# Patient Record
Sex: Female | Born: 1950 | Race: Black or African American | Hispanic: No | Marital: Single | Smoking: Never smoker
Health system: Southern US, Community
[De-identification: ages and names within clinical notes are randomized; demographics above are authoritative.]

## PROBLEM LIST (undated history)

## (undated) DIAGNOSIS — E119 Type 2 diabetes mellitus without complications: Secondary | ICD-10-CM

## (undated) DIAGNOSIS — K219 Gastro-esophageal reflux disease without esophagitis: Secondary | ICD-10-CM

## (undated) DIAGNOSIS — E7800 Pure hypercholesterolemia, unspecified: Secondary | ICD-10-CM

## (undated) DIAGNOSIS — Z6825 Body mass index (BMI) 25.0-25.9, adult: Secondary | ICD-10-CM

## (undated) DIAGNOSIS — I10 Essential (primary) hypertension: Secondary | ICD-10-CM

## (undated) HISTORY — PX: ABDOMINAL HYSTERECTOMY: SHX81

## (undated) HISTORY — DX: Body mass index (BMI) 25.0-25.9, adult: Z68.25

---

## 2020-08-16 ENCOUNTER — Emergency Department (HOSPITAL_BASED_OUTPATIENT_CLINIC_OR_DEPARTMENT_OTHER): Payer: Medicare HMO

## 2020-08-16 ENCOUNTER — Other Ambulatory Visit: Payer: Self-pay

## 2020-08-16 ENCOUNTER — Encounter (HOSPITAL_BASED_OUTPATIENT_CLINIC_OR_DEPARTMENT_OTHER): Payer: Self-pay

## 2020-08-16 ENCOUNTER — Emergency Department (HOSPITAL_BASED_OUTPATIENT_CLINIC_OR_DEPARTMENT_OTHER)
Admission: EM | Admit: 2020-08-16 | Discharge: 2020-08-16 | Disposition: A | Discharge status: Home / Self Care | Payer: Medicare HMO | Attending: Emergency Medicine | Admitting: Emergency Medicine

## 2020-08-16 DIAGNOSIS — Z79899 Other long term (current) drug therapy: Secondary | ICD-10-CM | POA: Insufficient documentation

## 2020-08-16 DIAGNOSIS — R079 Chest pain, unspecified: Secondary | ICD-10-CM

## 2020-08-16 DIAGNOSIS — U071 COVID-19: Secondary | ICD-10-CM | POA: Insufficient documentation

## 2020-08-16 DIAGNOSIS — E119 Type 2 diabetes mellitus without complications: Secondary | ICD-10-CM | POA: Diagnosis not present

## 2020-08-16 DIAGNOSIS — R0789 Other chest pain: Secondary | ICD-10-CM | POA: Diagnosis present

## 2020-08-16 DIAGNOSIS — Z7982 Long term (current) use of aspirin: Secondary | ICD-10-CM | POA: Diagnosis not present

## 2020-08-16 HISTORY — DX: Type 2 diabetes mellitus without complications: E11.9

## 2020-08-16 HISTORY — DX: Gastro-esophageal reflux disease without esophagitis: K21.9

## 2020-08-16 HISTORY — DX: Pure hypercholesterolemia, unspecified: E78.00

## 2020-08-16 LAB — CBC
HCT: 36.7 % (ref 36.0–46.0)
Hemoglobin: 12 g/dL (ref 12.0–15.0)
MCH: 26 pg (ref 26.0–34.0)
MCHC: 32.7 g/dL (ref 30.0–36.0)
MCV: 79.6 fL — ABNORMAL LOW (ref 80.0–100.0)
Platelets: 244 10*3/uL (ref 150–400)
RBC: 4.61 MIL/uL (ref 3.87–5.11)
RDW: 14.1 % (ref 11.5–15.5)
WBC: 4.2 10*3/uL (ref 4.0–10.5)
nRBC: 0 % (ref 0.0–0.2)

## 2020-08-16 LAB — BASIC METABOLIC PANEL
Anion gap: 11 (ref 5–15)
BUN: 15 mg/dL (ref 8–23)
CO2: 23 mmol/L (ref 22–32)
Calcium: 9.4 mg/dL (ref 8.9–10.3)
Chloride: 102 mmol/L (ref 98–111)
Creatinine, Ser: 0.88 mg/dL (ref 0.44–1.00)
GFR, Estimated: >60 mL/min (ref >60)
Glucose, Bld: 202 mg/dL — ABNORMAL HIGH (ref 70–99)
Potassium: 3.5 mmol/L (ref 3.5–5.1)
Sodium: 136 mmol/L (ref 135–145)

## 2020-08-16 LAB — TROPONIN I (HIGH SENSITIVITY): Troponin I (High Sensitivity): 3 ng/L (ref <18)

## 2020-08-16 LAB — D-DIMER, QUANTITATIVE: D-Dimer, Quant: 0.28 ug/mL-FEU (ref 0.00–0.50)

## 2020-08-16 NOTE — ED Notes (Signed)
Pt discharged to home. Discharge instructions have been discussed with patient and/or family members. Pt verbally acknowledges understanding d/c instructions, and endorses comprehension to checkout at registration before leaving.  °

## 2020-08-16 NOTE — ED Triage Notes (Signed)
Pt c/o R sided CP that has been intermittent since last night. Pt denies associated ShOB, nausea, or diaphoresis. Pain is reproducible to palpation.

## 2020-08-16 NOTE — Discharge Instructions (Addendum)
Call your primary care doctor or specialist as discussed in the next 2-3 days.   Return immediately back to the ER if:  Your symptoms worsen within the next 12-24 hours. You develop new symptoms such as new fevers, persistent vomiting, new pain, shortness of breath, or new weakness or numbness, or if you have any other concerns.  

## 2020-08-16 NOTE — ED Provider Notes (Signed)
[facility] EMERGENCY DEPARTMENT Provider Note   CSN: [id number] Arrival date & time: 08/16/20  2156     History Chief Complaint  Patient presents with  . Chest Pain    Heidi White is a 69 y.o. female.  Patient presents ER chief complaint of chest pain.  Describes an achy pain in the right upper mid chest region.  She states has had this before in the past but cannot give me good history of when she last had it or how often she has it.  She notes that this pain today started earlier in the morning has been persistent through the day describes as an ache otherwise nonradiating worse when she pushes on the area.  Denies any fevers or cough vomiting or diarrhea.        Past Medical History:  Diagnosis Date  . Diabetes mellitus without complication (HCC)   . GERD (gastroesophageal reflux disease)   . High cholesterol     There are no problems to display for this patient.   History reviewed. No pertinent surgical history.   OB History   No obstetric history on file.     No family history on file.  Social History   Tobacco Use  . Smoking status: Never Smoker  . Smokeless tobacco: Never Used  Vaping Use  . Vaping Use: Never used  Substance Use Topics  . Alcohol use: Never  . Drug use: Never    Home Medications Prior to Admission medications   Medication Sig Start Date End Date Taking? Authorizing Provider  omeprazole (PRILOSEC) 40 MG capsule  06/20/16  Yes [provider]  aspirin 325 MG tablet Take by mouth.    [provider]  atorvastatin (LIPITOR) 80 MG tablet Take 80 mg by mouth daily. 06/26/20   [provider]  JANUMET XR 50-1000 MG TB24 Take 1 tablet by mouth at bedtime. 07/17/20   [provider]  lisinopril (ZESTRIL) 20 MG tablet Take 20 mg by mouth daily. 04/21/20   [provider]  Vitamin D, Ergocalciferol, (DRISDOL) 1.25 MG (50000 UNIT) CAPS capsule Take 50,000 Units by mouth once a week.  06/16/20   [provider]    Allergies    Fish allergy and Oxycodone-acetaminophen  Review of Systems   Review of Systems  Constitutional: Negative for fever.  HENT: Negative for ear pain.   Eyes: Negative for pain.  Respiratory: Negative for cough.   Cardiovascular: Positive for chest pain.  Gastrointestinal: Negative for abdominal pain.  Genitourinary: Negative for flank pain.  Musculoskeletal: Negative for back pain.  Skin: Negative for rash.  Neurological: Negative for headaches.    Physical Exam Updated Vital Signs BP (!) 188/74 (BP Location: Right Arm)   Pulse (!) 106   Temp 99.6 F (37.6 C) (Oral)   Resp (!) 22   Ht 5\' 7"  (1.702 m)   Wt 81.6 kg   SpO2 98%   BMI 28.19 kg/m   Physical Exam Constitutional:      General: She is not in acute distress.    Appearance: Normal appearance.  HENT:     Head: Normocephalic.     Nose: Nose normal.  Eyes:     Extraocular Movements: Extraocular movements intact.  Cardiovascular:     Rate and Rhythm: Normal rate.  Pulmonary:     Effort: Pulmonary effort is normal.  Musculoskeletal:        General: Normal range of motion.     Cervical back: Normal range  of motion.  Neurological:     General: No focal deficit present.     Mental Status: She is alert. Mental status is at baseline.     ED Results / Procedures / Treatments   Labs (all labs ordered are listed, but only abnormal results are displayed) Labs Reviewed  BASIC METABOLIC PANEL - Abnormal; Notable for the following components:      Result Value   Glucose, Bld 202 (*)    All other components within normal limits  CBC - Abnormal; Notable for the following components:   MCV 79.6 (*)    All other components within normal limits  SARS CORONAVIRUS 2 (TAT 6-24 HRS)  D-DIMER, QUANTITATIVE (NOT AT [facility])  TROPONIN I (HIGH SENSITIVITY)    EKG None  Radiology DG Chest Port 1 View  Result Date: 08/16/2020 CLINICAL DATA:  Chest pain EXAM: PORTABLE  CHEST 1 VIEW COMPARISON:  03/07/2015 FINDINGS: The heart size and mediastinal contours are within normal limits. Both lungs are clear. The visualized skeletal structures are unremarkable. IMPRESSION: No active disease. Electronically Signed   By: Helyn Numbers MD   On: 08/16/2020 22:49    Procedures Procedures (including critical care time)  Medications Ordered in ED Medications - No data to display  ED Course  I have reviewed the triage vital signs and the nursing notes.  Pertinent labs & imaging results that were available during my care of the patient were reviewed by me and considered in my medical decision making (see chart for details).    MDM Rules/Calculators/A&P                          Labs otherwise are unremarkable white count of 4 hemoglobin 12 D-dimer -0.28.  Chemistry normal and troponin negative.  Patient given medication here with some improvement.  Chest x-ray shows no infiltrate effusion or edema.  Will advise outpatient follow-up with your doctor in 2 or 3 days.  Advising immediate return for worsening pain fevers or any additional concerns.   Final Clinical Impression(s) / ED Diagnoses Final diagnoses:  Nonspecific chest pain    Rx / DC Orders ED Discharge Orders    None       Cheryll Cockayne, MD 08/16/20 2303

## 2020-08-17 DIAGNOSIS — U071 COVID-19: Secondary | ICD-10-CM

## 2020-08-17 HISTORY — DX: COVID-19: U07.1

## 2020-08-17 LAB — SARS CORONAVIRUS 2 (TAT 6-24 HRS): SARS Coronavirus 2: POSITIVE — AB

## 2020-08-18 ENCOUNTER — Telehealth: Payer: Self-pay | Admitting: Nurse Practitioner

## 2020-08-18 ENCOUNTER — Encounter: Payer: Self-pay | Admitting: Nurse Practitioner

## 2020-08-18 DIAGNOSIS — E119 Type 2 diabetes mellitus without complications: Secondary | ICD-10-CM | POA: Insufficient documentation

## 2020-08-18 DIAGNOSIS — Z6825 Body mass index (BMI) 25.0-25.9, adult: Secondary | ICD-10-CM | POA: Insufficient documentation

## 2020-08-18 DIAGNOSIS — U071 COVID-19: Secondary | ICD-10-CM | POA: Insufficient documentation

## 2020-08-18 NOTE — Telephone Encounter (Signed)
Called patient to discuss Covid symptoms and the use of casirivimab/imdevimab, a monoclonal antibody infusion for those with mild to moderate Covid symptoms and at a high risk of hospitalization.  Pt is qualified for this infusion at the [place] infusion center due to; Specific high risk criteria : BMI > 25 and Diabetes   Message left to call back our hotline [phone].  Sariah Henkin, NP   

## 2021-07-10 IMAGING — DX DG CHEST 1V PORT
1 series · 1 of 1 positions shown · non-contrast
Comparison: 03/07/2015

CLINICAL DATA: Chest pain

EXAM:
PORTABLE CHEST 1 VIEW

[chest ap]
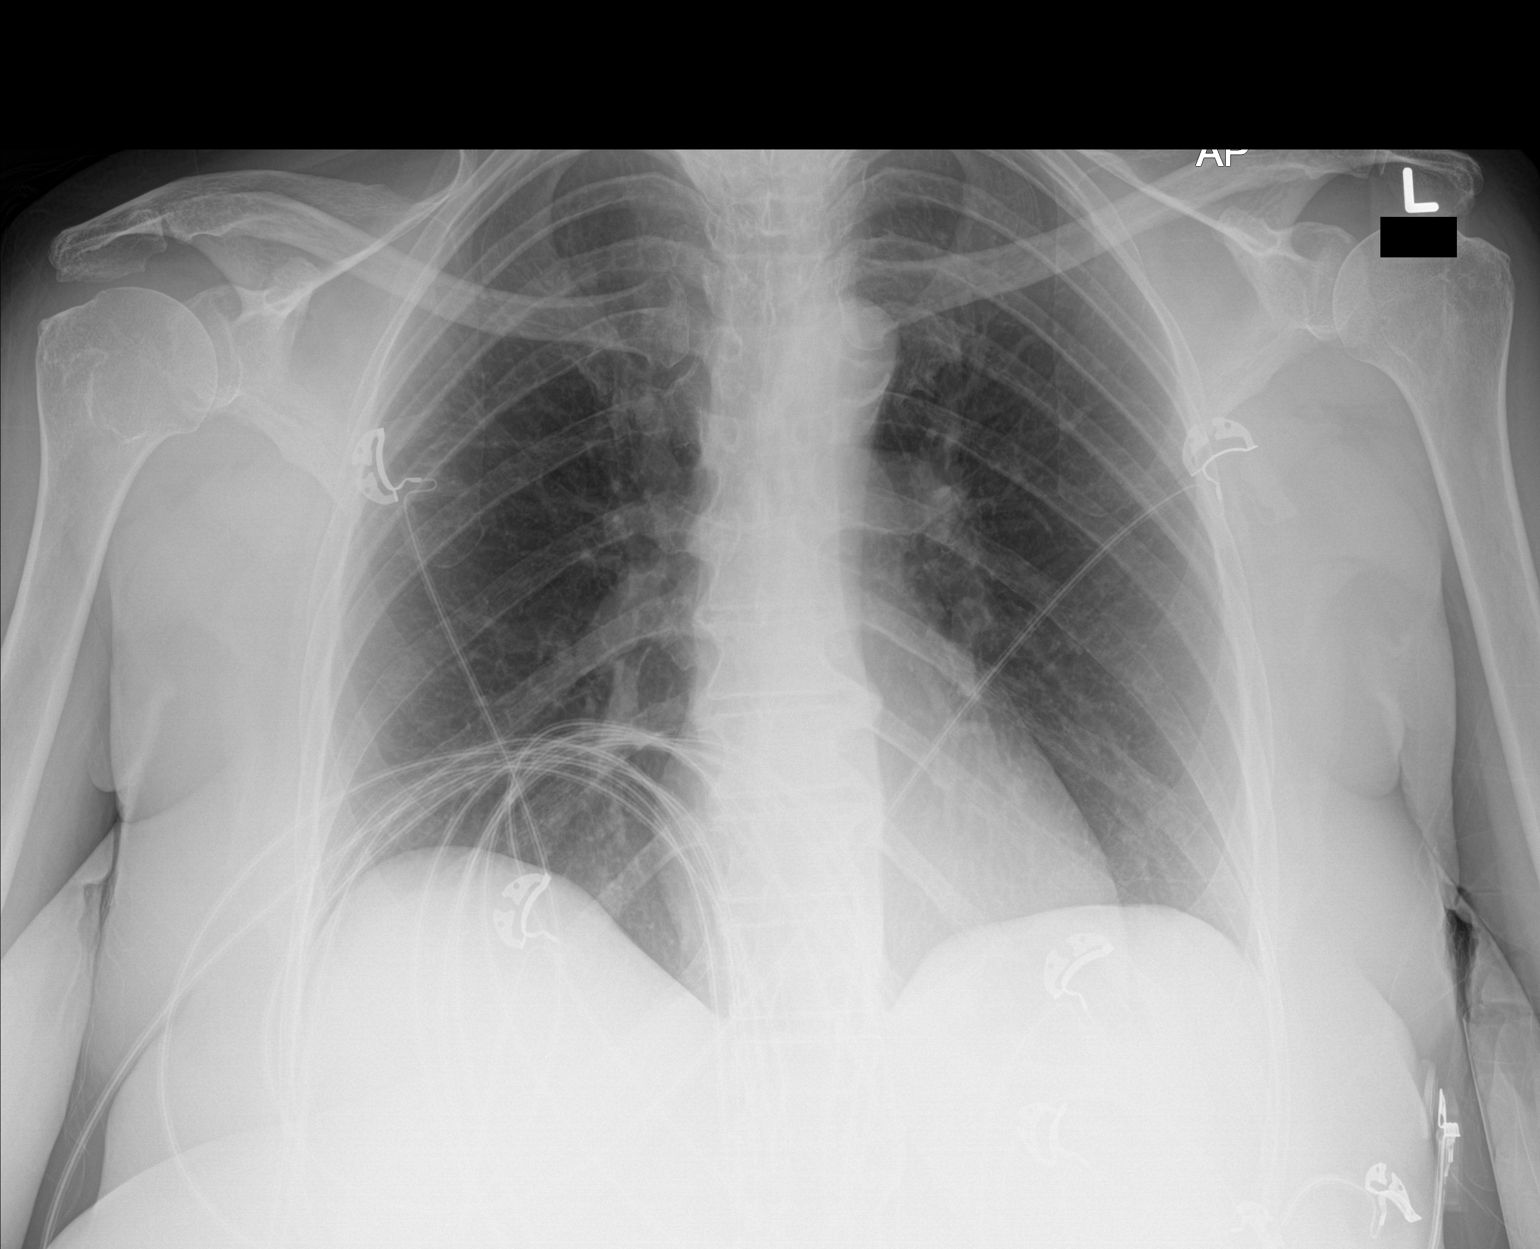

[1 of 1 positions shown; findings below may reference images not displayed]

FINDINGS: The heart size and mediastinal contours are within normal limits.
Both lungs are clear. The visualized skeletal structures are
unremarkable.
IMPRESSION: No active disease.

## 2022-09-06 ENCOUNTER — Encounter (HOSPITAL_BASED_OUTPATIENT_CLINIC_OR_DEPARTMENT_OTHER): Payer: Self-pay | Admitting: Emergency Medicine

## 2022-09-06 ENCOUNTER — Other Ambulatory Visit: Payer: Self-pay

## 2022-09-06 ENCOUNTER — Emergency Department (HOSPITAL_BASED_OUTPATIENT_CLINIC_OR_DEPARTMENT_OTHER)
Admission: EM | Admit: 2022-09-06 | Discharge: 2022-09-06 | Disposition: A | Discharge status: Home / Self Care | Payer: Medicare HMO | Attending: Emergency Medicine | Admitting: Emergency Medicine

## 2022-09-06 DIAGNOSIS — I10 Essential (primary) hypertension: Secondary | ICD-10-CM | POA: Diagnosis not present

## 2022-09-06 DIAGNOSIS — Z79899 Other long term (current) drug therapy: Secondary | ICD-10-CM | POA: Diagnosis not present

## 2022-09-06 DIAGNOSIS — M545 Low back pain, unspecified: Secondary | ICD-10-CM

## 2022-09-06 DIAGNOSIS — X501XXA Overexertion from prolonged static or awkward postures, initial encounter: Secondary | ICD-10-CM | POA: Diagnosis not present

## 2022-09-06 DIAGNOSIS — Z7982 Long term (current) use of aspirin: Secondary | ICD-10-CM | POA: Insufficient documentation

## 2022-09-06 DIAGNOSIS — E119 Type 2 diabetes mellitus without complications: Secondary | ICD-10-CM | POA: Insufficient documentation

## 2022-09-06 DIAGNOSIS — Z8616 Personal history of COVID-19: Secondary | ICD-10-CM | POA: Diagnosis not present

## 2022-09-06 DIAGNOSIS — Z7984 Long term (current) use of oral hypoglycemic drugs: Secondary | ICD-10-CM | POA: Diagnosis not present

## 2022-09-06 HISTORY — DX: Essential (primary) hypertension: I10

## 2022-09-06 MED ORDER — ACETAMINOPHEN 500 MG PO TABS
1000.0000 mg | ORAL_TABLET | Freq: Once | ORAL | Status: AC
  Administered 2022-09-06: 1000 mg via ORAL
  Filled 2022-09-06: qty 2

## 2022-09-06 MED ORDER — KETOROLAC TROMETHAMINE 15 MG/ML IJ SOLN
15.0000 mg | Freq: Once | INTRAMUSCULAR | Status: AC
  Administered 2022-09-06: 15 mg via INTRAMUSCULAR
  Filled 2022-09-06: qty 1

## 2022-09-06 NOTE — ED Notes (Signed)
Pt signed signature pad, signature did not cross to [facility]

## 2022-09-06 NOTE — ED Triage Notes (Signed)
Pt BIB [facility] with c/o bilateral lower back pain for several days. Denies injury, denies dysuria

## 2022-09-06 NOTE — Discharge Instructions (Addendum)
We evaluated you for your back pain.  Your physical exam did not show signs of any dangerous cause of back pain.  Your symptoms improved with Tylenol and anti-inflammatories in the emergency department.  Please take 1000 mg of Tylenol every 6 hours as needed for pain.  You can also apply ice or heat packs to your back to help with your symptoms.  You can buy lidocaine patches at the pharmacy over-the-counter, some people find that these help, you apply them to the site of pain and hopefully the numbing medicine will help relieve some discomfort.  If your symptoms are not relieved with Tylenol, I think it is okay to take 400 mg of Motrin every 6 hours as needed but try to limit this as much as possible given your age and try not to take it at all if you can manage without it.  I have attached some back stretches and exercises which she can try at home.  Please follow-up closely with your primary doctor as scheduled.  If you are still having pain they can refer you to physical therapy for further management.  Please return if you develop any new numbness or tingling, inability to walk, incontinence, numbness, pain with urination, numbness around your groin, severe worsening pain, or any other concerning symptoms.

## 2022-09-06 NOTE — ED Notes (Signed)
Pt aware urine specimen ordered. Pt reports inability to provide specimen at this time. Specimen collection device provided to patient. 

## 2022-09-06 NOTE — ED Provider Notes (Signed)
[place] EMERGENCY DEPARTMENT AT [facility] Provider Note  CSN: [id number] Arrival date & time: 09/06/22 1208  Chief Complaint(s) Back Pain  HPI Heidi White is a 72 y.o. female with history of diabetes, hypertension presenting to the emergency department with back pain.  Patient reports low back pain.  She reports that she developed low back pain this morning.  She was bending over and then felt a pull in her back.  She has not taken anything for this.  She called EMS.  She denies numbness or tingling, bowel or bladder incontinence.  Denies any trauma such as falls.  Denies any history of cancer.  Denies any weakness.  Has not tried to walk yet.  She reports significant pain.   Past Medical History Past Medical History:  Diagnosis Date   Body mass index (BMI) 25.0-25.9, adult    COVID-19 virus infection 08/2020   Diabetes mellitus without complication (HCC)    GERD (gastroesophageal reflux disease)    High cholesterol    Hypertension    Patient Active Problem List   Diagnosis Date Noted   Diabetes mellitus without complication ([place])    Body mass index (BMI) 25.0-25.9, adult    COVID-19 virus infection    Home Medication(s) Prior to Admission medications   Medication Sig Start Date End Date Taking? Authorizing Provider  aspirin 325 MG tablet Take by mouth.    [provider]  atorvastatin (LIPITOR) 80 MG tablet Take 80 mg by mouth daily. 06/26/20   [provider]  JANUMET XR 50-1000 MG TB24 Take 1 tablet by mouth at bedtime. 07/17/20   [provider]  lisinopril (ZESTRIL) 20 MG tablet Take 20 mg by mouth daily. 04/21/20   [provider]  omeprazole (PRILOSEC) 40 MG capsule  06/20/16   [provider]  Vitamin D, Ergocalciferol, (DRISDOL) 1.25 MG (50000 UNIT) CAPS capsule Take 50,000 Units by mouth once a week. 06/16/20   [provider]                                                                                                                                     Past Surgical History Past Surgical History:  Procedure Laterality Date   ABDOMINAL HYSTERECTOMY     Family History History reviewed. No pertinent family history.  Social History Social History   Tobacco Use   Smoking status: Never   Smokeless tobacco: Never  Vaping Use   Vaping Use: Never used  Substance Use Topics   Alcohol use: Never   Drug use: Never   Allergies Fish allergy and Oxycodone-acetaminophen  Review of Systems Review of Systems  All other systems reviewed and are negative.   Physical Exam Vital Signs  I have reviewed the triage vital signs BP (!) 141/77   Pulse 96   Temp 98 F (36.7 C) (Oral)   Resp 16   Ht 5\' 7"  (1.702 m)  Wt 74.8 kg   SpO2 98%   BMI 25.84 kg/m  Physical Exam Vitals and nursing note reviewed.  Constitutional:      Appearance: Normal appearance.  HENT:     Head: Normocephalic and atraumatic.     Mouth/Throat:     Mouth: Mucous membranes are moist.  Eyes:     Conjunctiva/sclera: Conjunctivae normal.  Cardiovascular:     Rate and Rhythm: Normal rate.  Pulmonary:     Effort: Pulmonary effort is normal. No respiratory distress.  Abdominal:     General: Abdomen is flat.     Tenderness: There is no right CVA tenderness or left CVA tenderness.  Musculoskeletal:        General: No deformity.     Comments: Mild bilateral lower back/lumbar paraspinal muscular tenderness.  No midline tenderness.  Negative straight leg raise bilaterally  Skin:    General: Skin is warm and dry.     Capillary Refill: Capillary refill takes less than 2 seconds.  Neurological:     General: No focal deficit present.     Mental Status: She is alert. Mental status is at baseline.     Comments: Strength 5 out of 5 in the bilateral lower extremities without sensory deficit to light touch  Psychiatric:        Mood and Affect: Mood normal.        Behavior: Behavior normal.     ED Results and  Treatments Labs (all labs ordered are listed, but only abnormal results are displayed) Labs Reviewed - No data to display                                                                                                                        Radiology No results found.  Pertinent labs & imaging results that were available during my care of the patient were reviewed by me and considered in my medical decision making (see MDM for details).  Medications Ordered in ED Medications  ketorolac (TORADOL) 15 MG/ML injection 15 mg (15 mg Intramuscular Given 09/06/22 1635)  acetaminophen (TYLENOL) tablet 1,000 mg (1,000 mg Oral Given 09/06/22 1634)                                                                                                                                     Procedures Procedures  (including critical care time)  Medical Decision Making / ED Course  MDM:  72 year old presenting to the emergency department with back pain.  Patient overall well-appearing, physical exam with some paraspinal muscular tenderness without midline tenderness.  Suspect likely muscle sprain from bending over.  Doubt occult fracture with no trauma.  Patient denies history of malignancy to suggest occult metastatic disease.  No urinary symptoms to suggest urinary infection and no CVA tenderness.  No neurologic symptoms to suggest occult spinal cord compression, occult spinal infection,, cauda equina syndrome.  Will treat symptoms and reassess.  Will need patient to ambulate prior to discharge given that she has not yet been able to ambulate with this pain.  Clinical Course as of 09/06/22 1802  Wynelle Link Sep 06, 2022  1758 Patient feels much better.  She has been able to ambulate without difficulty.  Discussed self-care for low back pain.  Discussed pain control regimen for home.  Patient has close follow-up with primary care doctor on Tuesday. Will discharge patient to home. All questions answered. Patient  comfortable with plan of discharge. Return precautions discussed with patient and specified on the after visit summary.  [WS]    Clinical Course User Index [WS] Lonell Grandchild, MD     Additional history obtained: -Additional history obtained from family and ems     Medicines ordered and prescription drug management: Meds ordered this encounter  Medications   ketorolac (TORADOL) 15 MG/ML injection 15 mg   acetaminophen (TYLENOL) tablet 1,000 mg    -I have reviewed the patients home medicines and have made adjustments as needed   Reevaluation: After the interventions noted above, I reevaluated the patient and found that they have improved  Co morbidities that complicate the patient evaluation  Past Medical History:  Diagnosis Date   Body mass index (BMI) 25.0-25.9, adult    COVID-19 virus infection 08/2020   Diabetes mellitus without complication (HCC)    GERD (gastroesophageal reflux disease)    High cholesterol    Hypertension       Dispostion: Disposition decision including need for hospitalization was considered, and patient discharged from emergency department.    Final Clinical Impression(s) / ED Diagnoses Final diagnoses:  Acute bilateral low back pain without sciatica     This chart was dictated using voice recognition software.  Despite best efforts to proofread,  errors can occur which can change the documentation meaning.    Lonell Grandchild, MD 09/06/22 [phone]
